# Patient Record
Sex: Male | Born: 1980 | Race: White | Hispanic: No | Marital: Married
Health system: Southern US, Community
[De-identification: ages and names within clinical notes are randomized; demographics above are authoritative.]

## PROBLEM LIST (undated history)

## (undated) DIAGNOSIS — T7840XA Allergy, unspecified, initial encounter: Secondary | ICD-10-CM

## (undated) HISTORY — DX: Allergy, unspecified, initial encounter: T78.40XA

---
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
## 2006-04-20 ENCOUNTER — Emergency Department (HOSPITAL_COMMUNITY): Admission: EM | Admit: 2006-04-20 | Discharge: 2006-04-20 | Payer: Self-pay | Admitting: Emergency Medicine

## 2006-05-02 ENCOUNTER — Ambulatory Visit: Payer: Self-pay | Admitting: Family Medicine

## 2006-05-16 ENCOUNTER — Encounter: Admission: RE | Admit: 2006-05-16 | Discharge: 2006-05-16 | Payer: Self-pay | Admitting: Family Medicine

## 2006-05-16 ENCOUNTER — Ambulatory Visit: Payer: Self-pay | Admitting: Family Medicine

## 2007-09-19 ENCOUNTER — Ambulatory Visit: Payer: Self-pay | Admitting: Family Medicine

## 2007-09-26 ENCOUNTER — Ambulatory Visit: Payer: Self-pay | Admitting: Family Medicine

## 2007-09-28 ENCOUNTER — Ambulatory Visit: Payer: Self-pay | Admitting: Family Medicine

## 2007-12-10 IMAGING — CT CT PARANASAL SINUSES LIMITED
1 series · 16 of 30 positions shown, 20 images · IV contrast (agent unspecified)
Comparison: none

CLINICAL DATA: Headaches for two months. 
 LIMITED CT SCAN OF THE SINUSES WITHOUT CONTRAST:
TECHNIQUE: Limited prone coronal CT images were obtained through the paranasal sinuses without intravenous contrast.

[Series 2: limited sinus prone · axial · 0.33mm/px · z∈[+6,+111]mm · 16 of 44 slices shown, 20 images]
[im 2/44  brain]
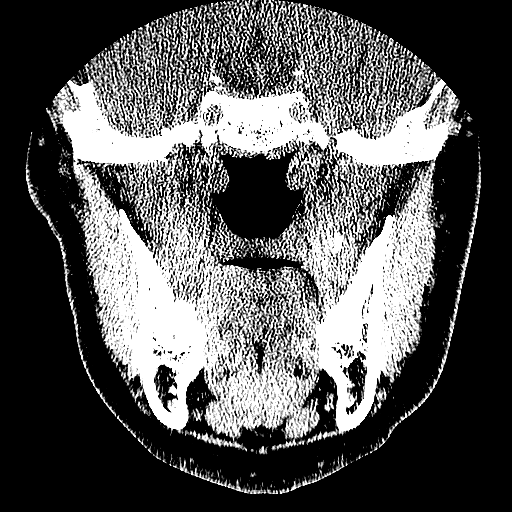
[im 2/44  bone]
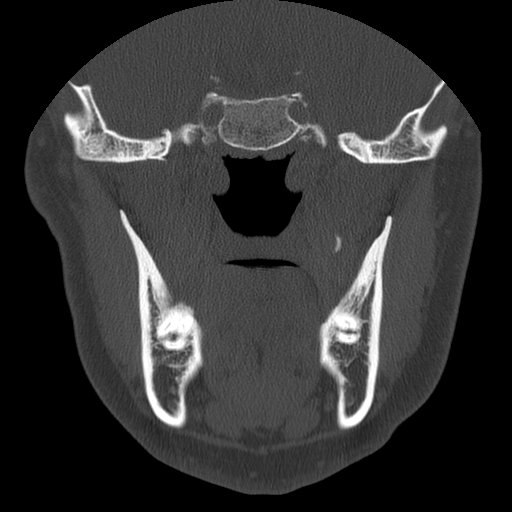
[im 5/44  bone]
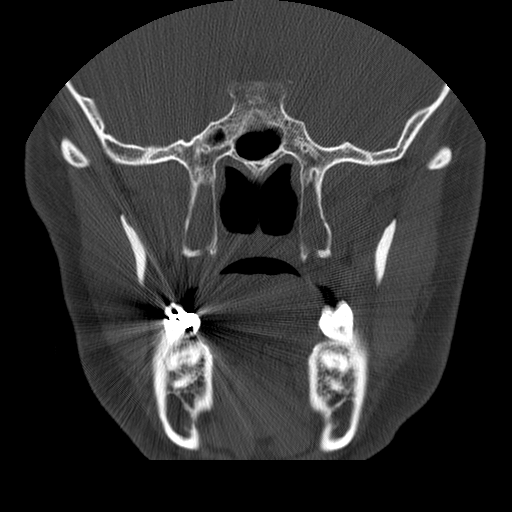
[im 8/44  bone]
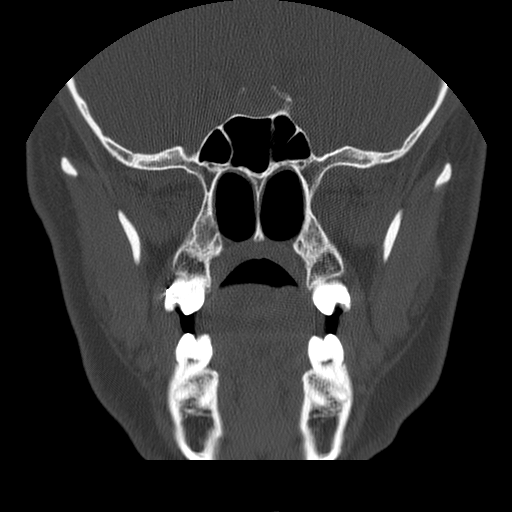
[im 11/44  bone]
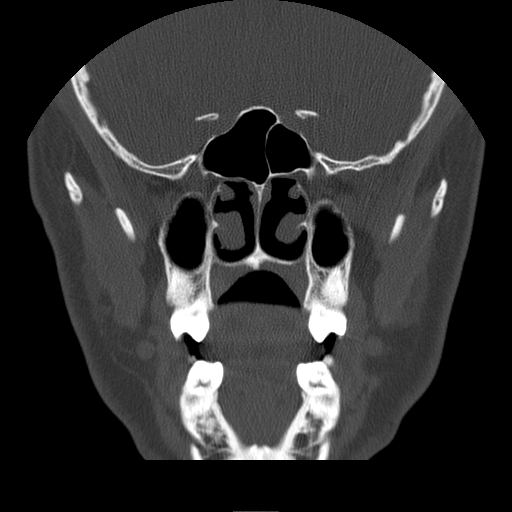
[im 12/44  brain]
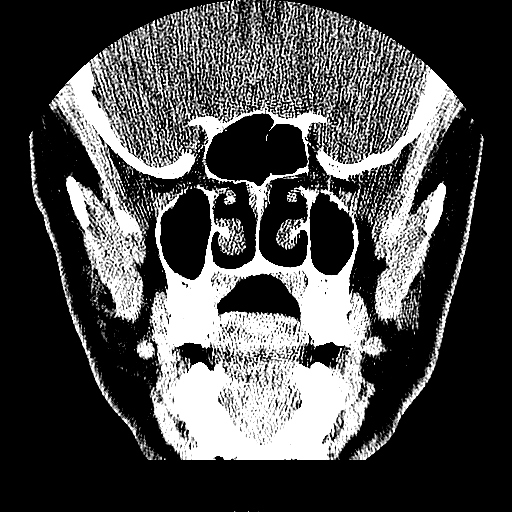
[im 12/44  bone]
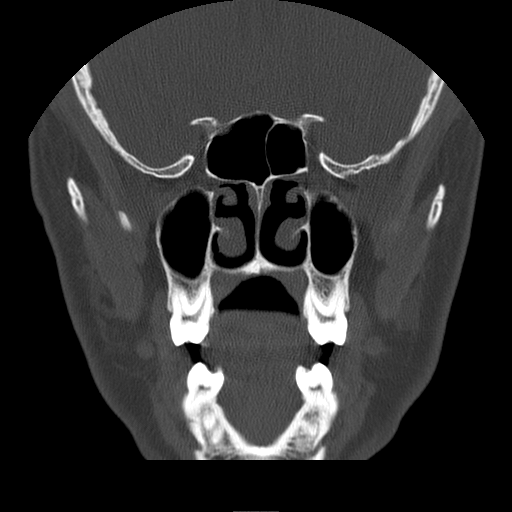
[im 15/44  bone]
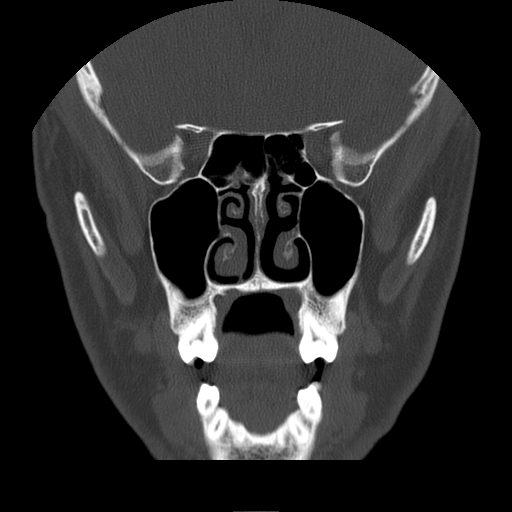
[im 18/44  bone]
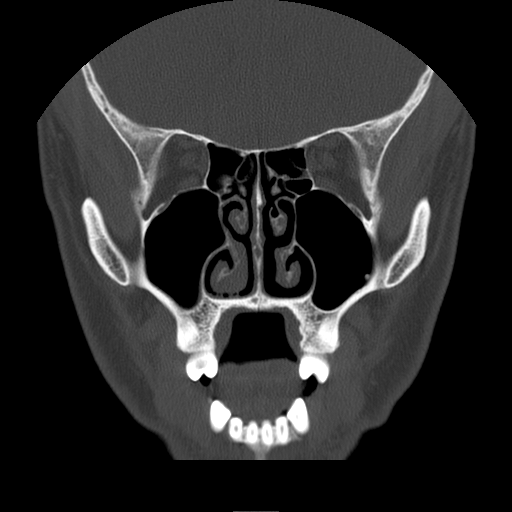
[im 21/44  bone]
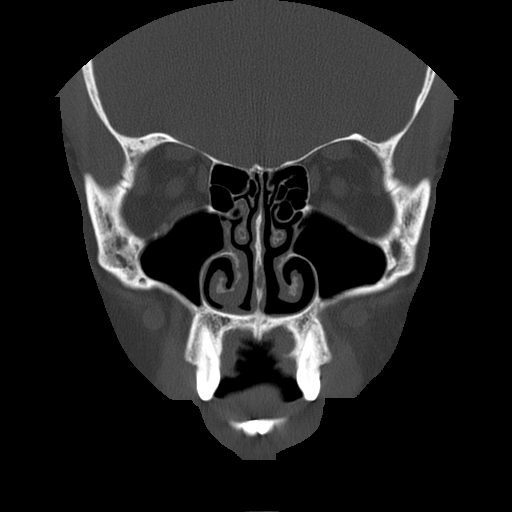
[im 23/44  brain]
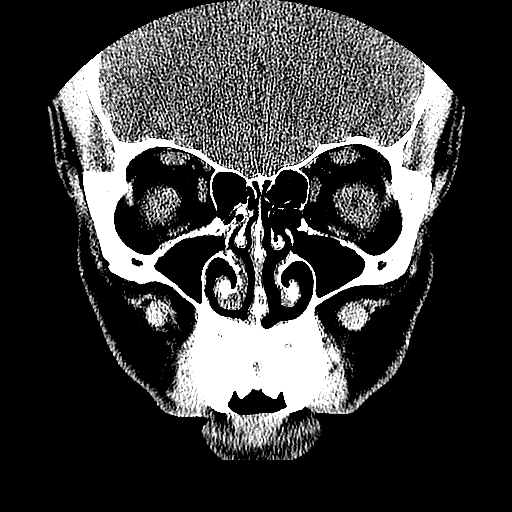
[im 23/44  bone]
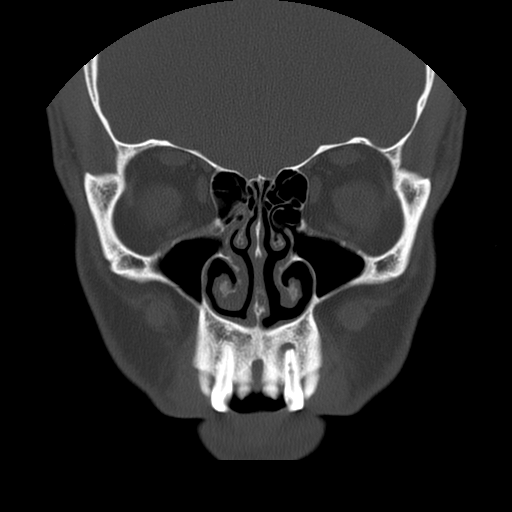
[im 26/44  bone]
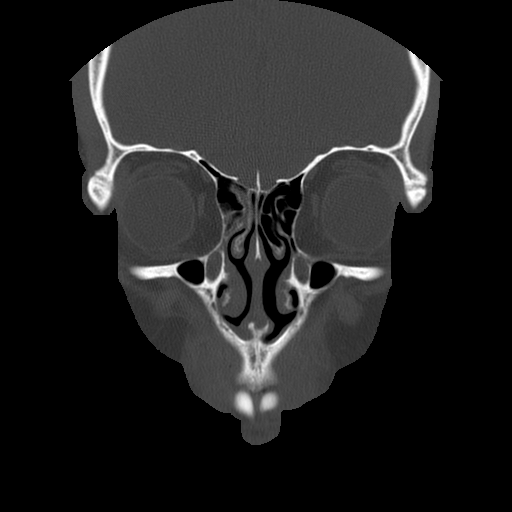
[im 29/44  bone]
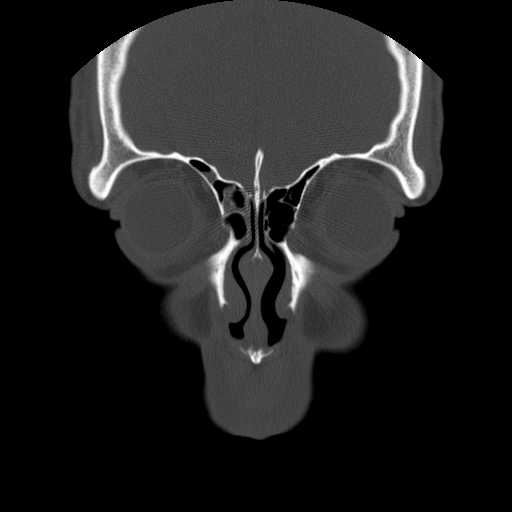
[im 32/44  bone]
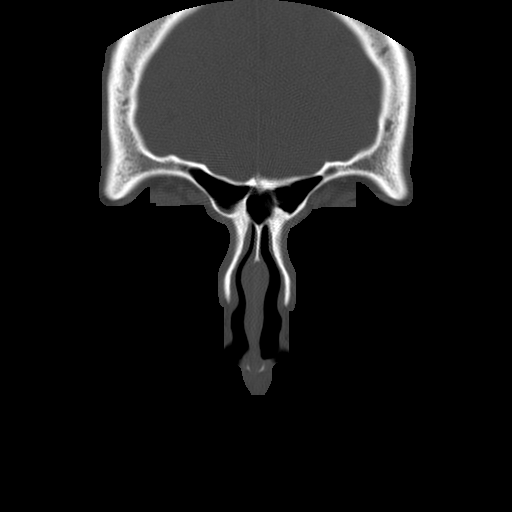
[im 33/44  brain]
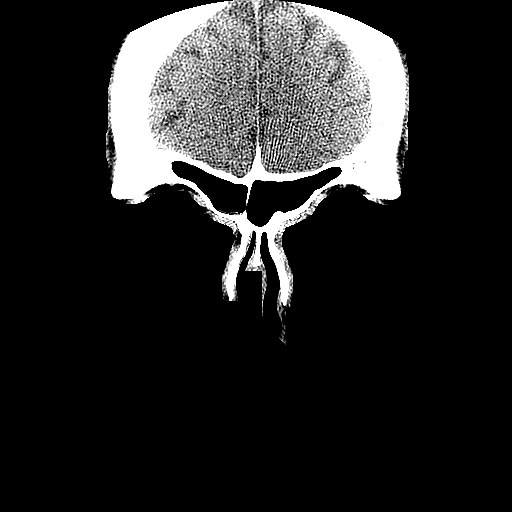
[im 33/44  bone]
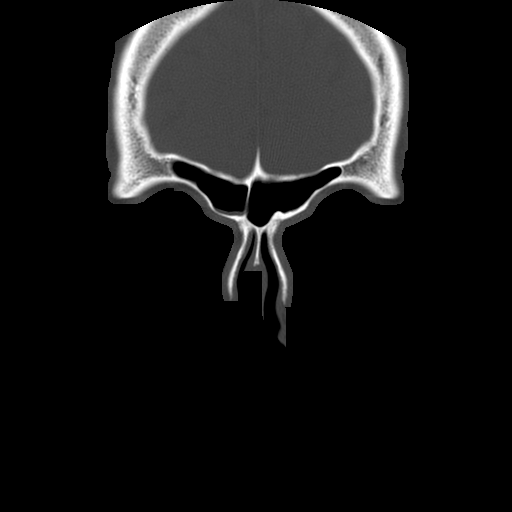
[im 36/44  bone]
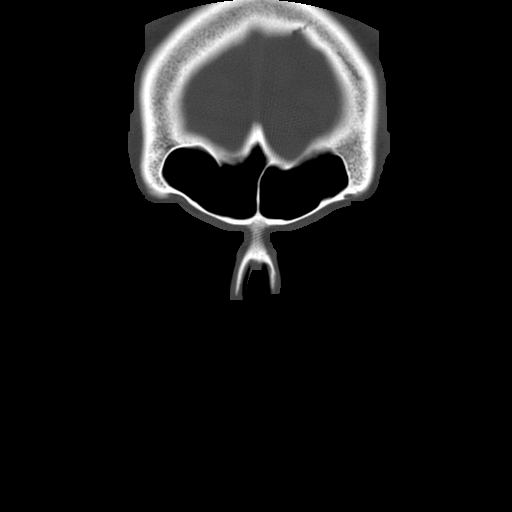
[im 39/44  bone]
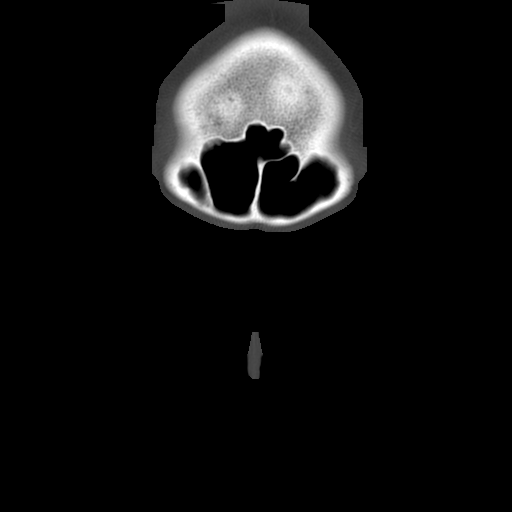
[im 42/44  bone]
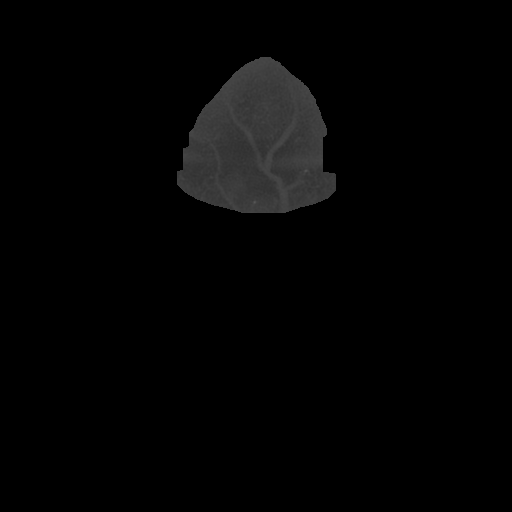

[16 of 30 positions shown; findings below may reference images not displayed]

FINDINGS: There is mild mucosal thickening in the left ethmoid air cells extending toward the left frontal sinus.  No air fluid level is seen, and the remainder of the paranasal sinuses are well-pneumatized.  The nasal turbinates are normal in size and position and the nasal airway is patent.  No bony abnormality is seen.
IMPRESSION: No sinusitis.  Mild mucosal thickening in the left ethmoid air cells extending to the left frontal sinus.

## 2008-09-11 ENCOUNTER — Ambulatory Visit: Payer: Self-pay | Admitting: Family Medicine

## 2008-10-01 ENCOUNTER — Ambulatory Visit: Payer: Self-pay | Admitting: Family Medicine

## 2010-05-21 ENCOUNTER — Ambulatory Visit: Payer: Self-pay | Admitting: Family Medicine

## 2011-04-14 ENCOUNTER — Encounter: Payer: Self-pay | Admitting: Family Medicine

## 2017-04-05 ENCOUNTER — Ambulatory Visit: Payer: Self-pay | Admitting: Family Medicine

## 2017-04-05 NOTE — Progress Notes (Deleted)
New patient office visit note:  Impression and Recommendations:    No diagnosis found.   There are no diagnoses linked to this encounter.  No problem-specific Assessment & Plan notes found for this encounter.   The patient was counseled, risk factors were discussed, anticipatory guidance given.   This SmartLink is deprecated. Use AVSMEDLIST instead to display the medication list for a patient.  No orders of the defined types were placed in this encounter.   This SmartLink is deprecated. Use AVSMEDLIST instead to display the medication list for a patient.  This SmartLink is deprecated. Use AVSMEDLIST instead to display the medication list for a patient.  No orders of the defined types were placed in this encounter.    Gross side effects, risk and benefits, and alternatives of medications discussed with patient.  Patient is aware that all medications have potential side effects and we are unable to predict every side effect or drug-drug interaction that may occur.  Expresses verbal understanding and consents to current therapy plan and treatment regimen.  No Follow-up on file.  Please see AVS handed out to patient at the end of our visit for further patient instructions/ counseling done pertaining to today's office visit.    Note: This document was prepared using Dragon voice recognition software and may include unintentional dictation errors.  ----------------------------------------------------------------------------------------------------------------------    Subjective:    Chief complaint:  No chief complaint on file.    HPI: Jeremiah Perez is a pleasant 36 y.o. male [facility] to review their medical history with me and establish care.   I asked the patient to review their chronic problem list with me to ensure everything was updated and accurate.    All recent office visits with other providers, any  medical records that patient brought in etc  - I reviewed today.     Also asked pt to get me medical [facility] providers/ specialists that they had seen within the past 3-5 years- if they are in private practice and/or do not work for a [facility], [facility], [facility] or [facility] owned practice.  Told them to call their specialists to clarify this if they are not sure.    No problems updated.    Wt Readings from Last 3 Encounters:  05/21/10 199 lb (90.3 kg)   BP Readings from Last 3 Encounters:  05/21/10 120/78   Pulse Readings from Last 3 Encounters:  05/21/10 72   BMI Readings from Last 3 Encounters:  No data found for BMI    No care team member to display  There are no active problems to display for this patient.    Past Medical History:  Diagnosis Date  . Allergy    RHINITIS  . Glaucoma      Past Medical History:  Diagnosis Date  . Allergy    RHINITIS  . Glaucoma      No past surgical history on file.   Family History  Problem Relation Age of Onset  . Asthma Father   . Hypertension Maternal Grandmother   . Diabetes Paternal Grandmother      Social History   Substance and Sexual Activity  Drug Use Not on file     Social History   Substance and Sexual Activity  Alcohol Use Not on file     Social History   Tobacco Use  Smoking Status Not on file     Outpatient  Encounter Medications as of 04/05/2017  Medication Sig  . Travoprost, BAK Free, (TRAVATAN) 0.004 % SOLN ophthalmic solution Place 1 drop into both eyes at bedtime.     No facility-administered encounter medications on file as of 04/05/2017.     Allergies: Penicillins   ROS   Objective:   There were no vitals taken for this visit. There is no height or weight on file to calculate BMI. General: Well Developed, well nourished, and in no acute distress.  Neuro: Alert and oriented x3, extra-ocular muscles intact, sensation grossly intact.  HEENT:[place]/AT, PERRLA,  neck supple, No carotid bruits Skin: no gross rashes  Cardiac: Regular rate and rhythm Respiratory: Essentially clear to auscultation bilaterally. Not using accessory muscles, speaking in full sentences.  Abdominal: not grossly distended Musculoskeletal: Ambulates w/o diff, FROM * 4 ext.  Vasc: less 2 sec cap RF, warm and pink  Psych:  No HI/SI, judgement and insight good, Euthymic mood. Full Affect.    No results found for this or any previous visit (from the past 2160 hour(s)).
